# Patient Record
Sex: Female | Born: 1990 | Race: Black or African American | Hispanic: No | Marital: Single | Smoking: Never smoker
Health system: Southern US, Community
[De-identification: ages and names within clinical notes are randomized; demographics above are authoritative.]

---

## 2010-01-27 ENCOUNTER — Encounter: Payer: Self-pay | Admitting: Family Medicine

## 2010-01-27 ENCOUNTER — Encounter: Payer: Self-pay | Admitting: Sports Medicine

## 2010-02-01 ENCOUNTER — Ambulatory Visit: Payer: Self-pay | Admitting: Sports Medicine

## 2010-02-01 DIAGNOSIS — M79609 Pain in unspecified limb: Secondary | ICD-10-CM | POA: Insufficient documentation

## 2010-02-01 DIAGNOSIS — R269 Unspecified abnormalities of gait and mobility: Secondary | ICD-10-CM | POA: Insufficient documentation

## 2010-02-02 ENCOUNTER — Encounter: Payer: Self-pay | Admitting: Family Medicine

## 2010-04-26 NOTE — Letter (Signed)
Summary: *Consult Note  [facility]  [address]   [place], [place] [address]   Phone: [phone]  Fax: [phone]    Re:    Meghan Hancock DOB:    1990/12/16   Dear Dr. Margaretha Sheffield:    Thank you for requesting that we see the above patient for consultation.  A copy of the detailed office note will be sent under separate cover, for your review.  Evaluation today is consistent with:  1)  ABNORMALITY OF GAIT (ICD-781.2) 2)  FOOT PAIN, RIGHT (ICD-729.5)   Our recommendation is for:  Meghan Hancock was fitted with temporary orthotics with scaphoid pads in office today.  Unfortunately, she did not have any shoes with her today, so unable to create custom orthotics at this time.  Will give trial of temporary orthotics and consider making custom orthotics for her basketball shoes at a later date.  I will follow-up with her at the [facility] next week.   New Orders include:  1)  Consultation Level II [99242] 2)  Sports Insoles [L3510] 3)  Foot Orthosis ( Arch Strap/Heel Cup) [X5284]   New Medications started today include: None   After today's visit, the patients current medications include: None   Thank you for this consultation.  If you have any further questions regarding the care of this patient, please do not hesitate to contact me @ [phone].  Thank you for this opportunity to look after your patient.  Sincerely,   Darene Lamer DO [facility]

## 2010-04-26 NOTE — Consult Note (Signed)
Summary: [facility]  [facility]   Imported By: Marily Memos 01/28/2010 10:06:55  _____________________________________________________________________  External Attachment:    Type:   Image     Comment:   External Document

## 2010-04-26 NOTE — Letter (Signed)
Summary: [facility]  [facility]   Imported By: Marily Memos 02/08/2010 14:03:44  _____________________________________________________________________  External Attachment:    Type:   Image     Comment:   External Document

## 2010-04-26 NOTE — Letter (Signed)
Summary: [facility]  [facility]   Imported By: Marily Memos 02/01/2010 15:47:16  _____________________________________________________________________  External Attachment:    Type:   Image     Comment:   External Document

## 2010-04-26 NOTE — Assessment & Plan Note (Signed)
Summary: ORTHOTICS PER [facility]   Vital Signs:  Patient profile:   20 year old female Height:      66 inches Weight:      140 pounds BMI:     22.68 BP sitting:   125 / 82  Vitals Entered By: Lillia Pauls CMA (February 01, 2010 3:26 PM)  CC:  possible orthotics.  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 Margaretha Sheffield.  Pt thought to have developed 4th/5th MT stress reaction after starting basketball practice.  She was treated successfully with post-op shoe & cross-training.  She is starting to advance activity and is able to job without difficulty or pain.  No previous issues with stress fractures or foot pain.  Allergies (verified): No Known Drug Allergies  Social History: student at [employer] player at [employer]  Review of Systems      See HPI  Physical Exam  General:  Well-developed,well-nourished,in no acute distress; alert,appropriate and cooperative throughout examination Msk:  FEET: no TTP along MTs, tarsals, phalanges b/l.  No pain with metatarsal squeeze b/l.   moderate pes planus b/l with calcaneal valgus.  Small mortons callous b/l.  GAIT: no leg length difference.  Increased dynamic pronation with walking.  Walks without a limp. Pulses:  +2/4 b/l Neurologic:  sensation intact to light touch.     Impression & Recommendations:  Problem # 1:  FOOT PAIN, RIGHT (ICD-729.5)  - Secondary to mild stress reaction of 4th & 5th MTs, pain resolved at this time  Orders: Sports Insoles (L3510) Foot Orthosis ( Arch Strap/Heel Cup) [phone])  Problem # 2:  ABNORMALITY OF GAIT (ICD-781.2)  - Moderate pes planus with calcaneal valgum & dynamic pronation with walking.  Abnormal gait mechanics likely contributed to her foot pain & stress reaction - Did not bring shoes to office today, so only able to fit with temporary orthotics today.  Fitted with Sports Insoles with small  scaphoid pads.  Should place these in her shoes & also wear in her basketball shoes. - Will have her follow-up with me at [facility] training room in the next week.  Would likely be candidate for custom orthotics for basketball shoes in the future.  Orders: Sports Insoles [phone]) Foot Orthosis ( Arch Strap/Heel Cup) [phone])   Orders Added: 1)  Consultation Level II [99242] 2)  Sports Insoles [L3510] 3)  Foot Orthosis ( Arch Strap/Heel Cup) [A2130]

## 2011-10-19 ENCOUNTER — Other Ambulatory Visit: Payer: Self-pay | Admitting: Family Medicine

## 2011-10-19 ENCOUNTER — Emergency Department (INDEPENDENT_AMBULATORY_CARE_PROVIDER_SITE_OTHER): Payer: Federal, State, Local not specified - PPO

## 2011-10-19 ENCOUNTER — Emergency Department (INDEPENDENT_AMBULATORY_CARE_PROVIDER_SITE_OTHER)
Admission: EM | Admit: 2011-10-19 | Discharge: 2011-10-19 | Disposition: A | Discharge status: Home / Self Care | Payer: Federal, State, Local not specified - PPO | Source: Home / Self Care

## 2011-10-19 ENCOUNTER — Encounter: Payer: Self-pay | Admitting: *Deleted

## 2011-10-19 DIAGNOSIS — N760 Acute vaginitis: Secondary | ICD-10-CM

## 2011-10-19 DIAGNOSIS — N898 Other specified noninflammatory disorders of vagina: Secondary | ICD-10-CM

## 2011-10-19 DIAGNOSIS — R1031 Right lower quadrant pain: Secondary | ICD-10-CM

## 2011-10-19 DIAGNOSIS — M549 Dorsalgia, unspecified: Secondary | ICD-10-CM

## 2011-10-19 DIAGNOSIS — R52 Pain, unspecified: Secondary | ICD-10-CM

## 2011-10-19 DIAGNOSIS — R1032 Left lower quadrant pain: Secondary | ICD-10-CM

## 2011-10-19 DIAGNOSIS — K5900 Constipation, unspecified: Secondary | ICD-10-CM

## 2011-10-19 LAB — POCT URINE PREGNANCY: Preg Test, Ur: NEGATIVE

## 2011-10-19 LAB — POCT URINALYSIS DIP (MANUAL ENTRY)
Blood, UA: NEGATIVE
Protein Ur, POC: NEGATIVE
Spec Grav, UA: 1.02 (ref 1.005–1.03)
Urobilinogen, UA: 0.2 (ref 0–1)
pH, UA: 5.5 (ref 5–8)

## 2011-10-19 MED ORDER — FLUCONAZOLE 150 MG PO TABS: 150.0000 mg | ORAL_TABLET | Freq: Once | ORAL | Status: AC

## 2011-10-19 MED ORDER — POLYETHYLENE GLYCOL 3350 17 GM/SCOOP PO POWD: 17.0000 g | Freq: Every day | ORAL | Status: AC

## 2011-10-19 MED ORDER — METRONIDAZOLE 500 MG PO TABS: 500.0000 mg | ORAL_TABLET | Freq: Two times a day (BID) | ORAL | Status: AC

## 2011-10-19 NOTE — ED Notes (Signed)
Patient c/o mid/low back pain after meals and drinking fluids x 2 months. She also reports discharge, itching and yeast x 1 month.

## 2011-10-19 NOTE — ED Provider Notes (Signed)
History     CSN: [id number]  Arrival date & time 10/19/11  1400   First MD Initiated Contact with Patient 10/19/11 1403      Chief Complaint  Patient presents with  . Back Pain  . Vaginal Discharge   HPI  Back pain: Patient reports back pain after eating over the past 2 months. Patient states that back pain is usually midthoracic. Patient denies any radiation of back pain to the abdomen. Patient describes back pain is sharp. Back pain seems to be associated with both eating and drinking. Patient denies any recent back trauma. No history of kidney stones. No dysuria, hematuria, increased urinary frequency. 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 This back pain is different. Patient denies any recent changes in her diet. Patient does complain of significant gaseous distention associated with eating and drinking. Patient denies any constipation though does report having 2-3 bowel movements per week.  VAGINAL DISCHARGE Onset: 1 month Description: vaginal itching and discharge  Modifying factors: Was previously [place] Ob-Gyn in [place] for BV and ? Yeast infection. Sxs resolved x 1 month and then recurred. Sxs feel like previous yeast infections in the past.   Symptoms Odor: mild  Itching: yes Vaginal burning: mild Dysuria: no Bleeding: no Pelvic pain:+ RLQ pain, mild  Back pain: yes Fever: no Genital sores: no Rash: no Dyspareunia: no GI Symptoms: no  Red Flags:  Missed period: no Recent antibiotics: no Possible STD exposure: no IUD: no Diabetes: no    History reviewed. No pertinent past medical history.  History reviewed. No pertinent past surgical history.  Family History  Problem Relation Age of Onset  . Hypertension Mother   . Hypertension Father   . Hypertension Sister     History  Substance Use Topics  . Smoking status: Never Smoker   . Smokeless tobacco: Not on file  .  Alcohol Use: No    OB History    Grav Para Term Preterm Abortions TAB SAB Ect Mult Living                  Review of Systems  All other systems reviewed and are negative.    Allergies  Review of patient's allergies indicates no known allergies.  Home Medications   Current Outpatient Rx  Name Route Sig Dispense Refill  . FLUCONAZOLE 150 MG PO TABS Oral Take 1 tablet (150 mg total) by mouth once. 1 tablet 0  . POLYETHYLENE GLYCOL 3350 PO POWD Oral Take 17 g by mouth daily. 255 g 0    BP 134/85  Pulse 65  Temp 98.3 F (36.8 C) (Oral)  Resp 16  Ht 5\' 6"  (1.676 m)  Wt 140 lb (63.504 kg)  BMI 22.60 kg/m2  SpO2 100%  LMP 10/04/2011  Physical Exam  Constitutional: She is oriented to person, place, and time. She appears well-developed and well-nourished.  HENT:  Head: Normocephalic and atraumatic.  Eyes: Conjunctivae are normal. Pupils are equal, round, and reactive to light.  Neck: Normal range of motion. Neck supple.  Cardiovascular: Normal rate and regular rhythm.   Pulmonary/Chest: Effort normal and breath sounds normal.  Abdominal: Soft.       Mild RLQ tenderness, + bowel sounds   Genitourinary:       Normal external genitalia, + vaginal discharge, No CMT, minimal to mild adnexal tenderness  Marked stool burden on palpation of posterior portion of vaginal cavity  Musculoskeletal: Normal range of motion.       Mild mid thoracic tenderness to palpation    Neurological: She is alert and oriented to person, place, and time.  Skin: Skin is warm.    ED Course  Procedures (including critical care time)   Labs Reviewed  POCT URINALYSIS DIP (MANUAL ENTRY)  POCT URINE PREGNANCY  WET PREP, GENITAL  GC/CHLAMYDIA PROBE AMP, GENITAL   Dg Abd 1 View  10/19/2011  *RADIOLOGY REPORT*  Clinical Data: Back pain  ABDOMEN - 1 VIEW  Comparison: None.  Findings: No dilated loops of large or small bowel.  Gas and stool within rectum.  No pathologic calcifications.  Lumbar  scoliosis noted.  IMPRESSION: Moderate volume stool throughout colon.  No acute findings.  Original Report Authenticated By: Genevive Bi, M.D.     1. Constipation   2. Vaginal Discharge   3. RLQ abdominal pain       MDM  Back pain assd with eating and drinking likely secondary to constipation. Noted stool burden on KUB.  Will start pt on miralax for this. No clinical red flags/risk factors for GB disease currently (no RUQ pain, nausea, non obese), though would consider abd u/s if sxs persist.   Will treat pt for BV and vaginal candidiasis.  UA nonremarkable.  U preg negative.  Will send off GC/Chl.  Will send off wet prep.  Given mild adnexal tenderness, will obtain pelvic u/s to eval for GYN process.  Discussed gyn and infectious red flags for reevaluation.  Follow up pending imaging and lab results.  Handout given.      The patient and/or caregiver has been counseled thoroughly with regard to treatment plan and/or medications prescribed including dosage, schedule, interactions, rationale for use, and possible side effects and they verbalize understanding. Diagnoses and expected course of recovery discussed and will return if not improved as expected or if the condition worsens. Patient and/or caregiver verbalized understanding.               Floydene Flock, MD 10/19/11 [phone]

## 2011-10-20 ENCOUNTER — Ambulatory Visit (INDEPENDENT_AMBULATORY_CARE_PROVIDER_SITE_OTHER): Payer: Federal, State, Local not specified - PPO

## 2011-10-20 DIAGNOSIS — N949 Unspecified condition associated with female genital organs and menstrual cycle: Secondary | ICD-10-CM

## 2011-10-20 DIAGNOSIS — R52 Pain, unspecified: Secondary | ICD-10-CM

## 2011-10-20 LAB — GC/CHLAMYDIA PROBE AMP, GENITAL
Chlamydia, DNA Probe: NEGATIVE
GC Probe Amp, Genital: NEGATIVE

## 2011-10-20 LAB — WET PREP, GENITAL: Trich, Wet Prep: NONE SEEN

## 2011-10-22 ENCOUNTER — Telehealth: Payer: Self-pay | Admitting: Emergency Medicine

## 2011-10-23 ENCOUNTER — Telehealth: Payer: Self-pay

## 2011-10-23 NOTE — Telephone Encounter (Signed)
Message copied by Chalmers Cater on Mon Oct 23, 2011  8:57 AM ------      Message from: Floydene Flock      Created: Mon Oct 23, 2011  8:14 AM       Please inform pt that ultrasound results were negative

## 2011-10-23 NOTE — ED Notes (Signed)
Patient advised of ultrasound results.  

## 2011-10-26 NOTE — ED Provider Notes (Signed)
Agree with exam, assessment, and plan.   Owens Hara A Brittannie Tawney, MD 10/26/11 0846 

## 2012-01-25 ENCOUNTER — Encounter: Payer: Self-pay | Admitting: Sports Medicine

## 2012-01-25 ENCOUNTER — Ambulatory Visit (INDEPENDENT_AMBULATORY_CARE_PROVIDER_SITE_OTHER): Payer: Federal, State, Local not specified - PPO | Admitting: Sports Medicine

## 2012-01-25 VITALS — BP 123/79 | HR 53 | Ht 66.0 in | Wt 140.0 lb

## 2012-01-25 DIAGNOSIS — M2141 Flat foot [pes planus] (acquired), right foot: Secondary | ICD-10-CM

## 2012-01-25 DIAGNOSIS — M216X9 Other acquired deformities of unspecified foot: Secondary | ICD-10-CM

## 2012-01-25 DIAGNOSIS — M2140 Flat foot [pes planus] (acquired), unspecified foot: Secondary | ICD-10-CM

## 2012-01-25 DIAGNOSIS — M2142 Flat foot [pes planus] (acquired), left foot: Secondary | ICD-10-CM

## 2012-01-25 NOTE — Progress Notes (Signed)
  Subjective:    Patient ID: Meghan Hancock, female    DOB: 09-09-1990, 21 y.o.   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 She was evaluated by Dr. Clementeen Graham in the training room who recommended she come in today for orthotics. She has no foot pain currently but basketball season is getting ready to start and she has a history of a stress fracture in her left foot in 2011. She is otherwise healthy. Takes no chronic medications. Socially she does not smoke, does not drink alcohol, and is a Consulting civil engineer at [employer].    Review of Systems     Objective:   Physical Exam Well-developed, well-nourished. No acute distress. Awake alert and oriented x3  Examination of each of her feet in the standing position shows moderate pes planus. She has significant pronation with running and walking. She has no tenderness to palpation across the dorsum of her foot. No pain with metatarsal squeeze. Ankle exam is unremarkable. Neurovascularly intact distally. Walking without a limp.       Assessment & Plan:  1. Bilateral pes planus with pronation   Patient is fitted today with a pair of green sports insoles with scaphoid pads. She will wear these in her basketball shoes. We will schedule a followup visit in 3 weeks and we will plan on making her custom orthotics at that time if she finds the temporary inserts to be comfortable.

## 2012-02-15 ENCOUNTER — Ambulatory Visit: Payer: Federal, State, Local not specified - PPO | Admitting: Sports Medicine

## 2012-03-07 ENCOUNTER — Ambulatory Visit (INDEPENDENT_AMBULATORY_CARE_PROVIDER_SITE_OTHER): Payer: Federal, State, Local not specified - PPO | Admitting: Family Medicine

## 2012-03-07 VITALS — BP 110/74 | Ht 66.0 in | Wt 138.0 lb

## 2012-03-07 DIAGNOSIS — R269 Unspecified abnormalities of gait and mobility: Secondary | ICD-10-CM

## 2012-03-07 NOTE — Assessment & Plan Note (Signed)
Fitted for a pair of custom orthotics today, do to significant pes planus, ankle subluxation, and abnormal gait.  Will followup as needed.

## 2012-03-07 NOTE — Progress Notes (Signed)
Meghan Hancock is a 21 y.o. female who presents to [facility] today for orthotic evaluation.  Patient was found to have significant pes planus with ankle subluxation an abnormal gait.  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  She was seen last month and fitted with a pair of temporary orthotics which have helped her pain.  She's here today for custom orthotics.    PMH reviewed.  History  Substance Use Topics  . Smoking status: Never Smoker   . Smokeless tobacco: Never Used  . Alcohol Use: No   ROS as above otherwise neg   Exam:  BP 110/74  Ht 5\' 6"  (1.676 m)  Wt 138 lb (62.596 kg)  BMI 22.27 kg/m2 Gen: Well NAD MSK: Examination of each of her feet in the standing position shows moderate pes planus. She has significant pronation with running and walking. She has no tenderness to palpation across the dorsum of her foot. No pain with metatarsal squeeze. Ankle exam is unremarkable. Neurovascularly intact distally. Walking without a limp.   Patient was fitted for a : standard, cushioned, semi-rigid orthotic. The orthotic was heated and afterward the patient stood on the orthotic blank positioned on the orthotic stand. The patient was positioned in subtalar neutral position and 10 degrees of ankle dorsiflexion in a weight bearing stance. After completion of molding, a stable base was applied to the orthotic blank. The blank was ground to a stable position for weight bearing. Size: 8 Base: Blue EVA Posting and Padding: none.

## 2012-03-29 ENCOUNTER — Ambulatory Visit (INDEPENDENT_AMBULATORY_CARE_PROVIDER_SITE_OTHER): Payer: Federal, State, Local not specified - PPO | Admitting: Sports Medicine

## 2012-03-29 VITALS — BP 121/82 | Temp 99.3°F | Ht 66.0 in | Wt 138.0 lb

## 2012-03-29 DIAGNOSIS — A088 Other specified intestinal infections: Secondary | ICD-10-CM

## 2012-03-29 DIAGNOSIS — A084 Viral intestinal infection, unspecified: Secondary | ICD-10-CM | POA: Insufficient documentation

## 2012-03-29 LAB — CBC
MCH: 31.9 pg (ref 26.0–34.0)
Platelets: 237 10*3/uL (ref 150–400)
RBC: 4.32 MIL/uL (ref 3.87–5.11)
WBC: 6.1 10*3/uL (ref 4.0–10.5)

## 2012-03-29 MED ORDER — PROMETHAZINE HCL 12.5 MG PO TABS: 12.5000 mg | ORAL_TABLET | Freq: Four times a day (QID) | ORAL | Status: DC | PRN

## 2012-03-29 NOTE — Assessment & Plan Note (Signed)
This is likely viral gastroenteritis, which is currently present in the community.  Her differential includes a remote possibility of appendicitis, however she does not have peritoneal signs currently. Plan: We'll obtain a CBC to ensure no significant leukocytosis consistent with appendicitis. Will treat with Phenergan, and oral fluid rehydration. I will check on the patient tomorrow before then and basketball game. Patient will call if she worsens. I provided by personal cell phone number

## 2012-03-29 NOTE — Progress Notes (Signed)
Meghan Hancock is a 22 y.o. female who presents to [facility] today for nausea and vomiting present for one day. Patient notes mild abdominal pain in the right lower contract additionally. Additionally she notes a headache. She is able to drink and is continuing to vomit. She denies any diarrhea or blood in the stool. She notes normal menstruation and denies any vaginal discharge or dysuria. She feels mildly weak but is able to keep fluid down and still makes urine. She notes a mild subjective fever but denies any trouble breathing chest pain palpitations.   No sick contacts  PMH reviewed. No history of abdominal surgery. Last menstrual period was in one week. History  Substance Use Topics  . Smoking status: Never Smoker   . Smokeless tobacco: Never Used  . 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 ROS as above otherwise neg   Exam:  BP 121/82  Temp 99.3 F (37.4 C) (Oral)  Ht 5\' 6"  (1.676 m)  Wt 138 lb (62.596 kg)  BMI 22.27 kg/m2 Gen: Well NAD LUNGS: Normal work of breathing, clear to auscultation bilaterally Heart: Regular rate and rhythm no murmurs rubs or gallops Abdomen: Soft nondistended normal active bowel sounds mildly tender in the right lower quadrant. No rebound or guarding. Extremities: Warm and well perfused

## 2012-03-29 NOTE — Patient Instructions (Addendum)
Thank you for coming in today. I think you have stomach virus.  It is possible but unlikely that you have appendicitis.  If you get worse let me know.  Take the promethazine for nausea as needed. It will make you sleepy.  See me at the basketball game tomorrow.  My cell is [phone]

## 2012-06-11 ENCOUNTER — Other Ambulatory Visit: Payer: Self-pay | Admitting: Family Medicine

## 2013-12-01 ENCOUNTER — Emergency Department (INDEPENDENT_AMBULATORY_CARE_PROVIDER_SITE_OTHER)
Admission: EM | Admit: 2013-12-01 | Discharge: 2013-12-01 | Disposition: A | Discharge status: Home / Self Care | Payer: Federal, State, Local not specified - PPO | Source: Home / Self Care | Attending: Family Medicine | Admitting: Family Medicine

## 2013-12-01 DIAGNOSIS — M62838 Other muscle spasm: Secondary | ICD-10-CM

## 2013-12-01 MED ORDER — CYCLOBENZAPRINE HCL 10 MG PO TABS: 10.0000 mg | ORAL_TABLET | Freq: Three times a day (TID) | ORAL | Status: AC

## 2013-12-01 NOTE — ED Provider Notes (Signed)
CSN: [id number]     Arrival date & time 12/01/13  1646 History   First MD Initiated Contact with Patient 12/01/13 1737     Chief Complaint  Patient presents with  . Neck Pain      HPI Comments: Patient awoke this morning with pain in her upper back and neck, radiating to the shoulders.  The pain is worse with movement and deep inspiration.  No fevers, chills, and sweats.  She recalls no injury or change in physical activities. She had a similar occurrence 7 months ago, resolved with Flexeril.  Patient is a 23 y.o. female presenting with back pain. The history is provided by the patient.  Back Pain Pain location: upper back and neck. Quality:  Aching Radiates to:  R shoulder Pain severity:  Mild Pain is:  Same all the time Onset quality:  Sudden Duration:  9 hours Timing:  Constant Progression:  Unchanged Chronicity:  New Relieved by:  None tried Worsened by:  Deep breathing and lying down Ineffective treatments:  NSAIDs Associated symptoms: no chest pain, no fever and no paresthesias     No past medical history on file. No past surgical history on file. Family History  Problem Relation Age of Onset  . Hypertension Mother   . Hypertension Father   . Hypertension Sister    History  Substance Use Topics  . Smoking status: Never Smoker   . Smokeless tobacco: Never Used  . Alcohol Use: No   OB History   Grav Para Term Preterm Abortions TAB SAB Ect Mult Living                 Review of Systems  Constitutional: Negative for fever.  Cardiovascular: Negative for chest pain.  Musculoskeletal: Positive for back pain.  Neurological: Negative for paresthesias.  All other systems reviewed and are negative.   Allergies  Review of patient's allergies indicates no known allergies.  Home Medications   Prior to Admission medications   Medication Sig Start Date End Date Taking? Authorizing Provider  cyclobenzaprine (FLEXERIL) 10 MG tablet Take 1 tablet (10 mg total) by mouth  3 (three) times daily. 12/01/13   Lattie Haw, MD   BP 132/85  Pulse 79  Temp(Src) 98.4 F (36.9 C) (Oral)  Ht  (1.676 m)  Wt 146 lb (66.225 kg)  BMI 23.58 kg/m2  SpO2 99% Physical Exam  Nursing note and vitals reviewed. Constitutional: She is oriented to person, place, and time. She appears well-developed and well-nourished. No distress.  HENT:  Head: Normocephalic.  Mouth/Throat: Oropharynx is clear and moist.  Eyes: Conjunctivae and EOM are normal. Pupils are equal, round, and reactive to light.  Neck: Normal range of motion. Neck supple.  Cardiovascular: Normal heart sounds.   Pulmonary/Chest: Breath sounds normal. She exhibits tenderness.    There is tenderness to palpation over the inferior sternum as noted on diagram.    Abdominal: There is no tenderness.  Musculoskeletal:       Cervical back: She exhibits tenderness. She exhibits normal range of motion and no bony tenderness.       Back:  There is tenderness to palpation over the posterior neck and trapezius muscles bilaterally.  Lymphadenopathy:    She has no cervical adenopathy.  Neurological: She is alert and oriented to person, place, and time.  Skin: Skin is warm and dry. No rash noted.    ED Course  Procedures  none     MDM   1. Trapezius muscle  spasm    Begin Flexeril. Apply ice pack for 15 to 20 minutes, 3 to 4 times daily  Continue until pain decreases.  May take Ibuprofen , 4 tabs every 8 hours with food.  Begin neck range of motion and stretching exercises. Followup with Dr. Rodney [facility] ([facility]) if not improving about two weeks.     Lattie Haw, MD 12/03/13 [phone]

## 2013-12-01 NOTE — ED Notes (Signed)
Neck and shoulder pain when taking a breath her breathing doesn't seem right 7/10

## 2013-12-01 NOTE — Discharge Instructions (Signed)
Apply ice pack for 15 to 20 minutes, 3 to 4 times daily  Continue until pain decreases.  May take Ibuprofen , 4 tabs every 8 hours with food.  Begin neck range of motion and stretching exercises.

## 2014-03-07 IMAGING — CR DG ABDOMEN 1V
1 series · 1 of 1 positions shown · non-contrast
Comparison: None.

CLINICAL DATA: Back pain

ABDOMEN - 1 VIEW

[view not recorded]
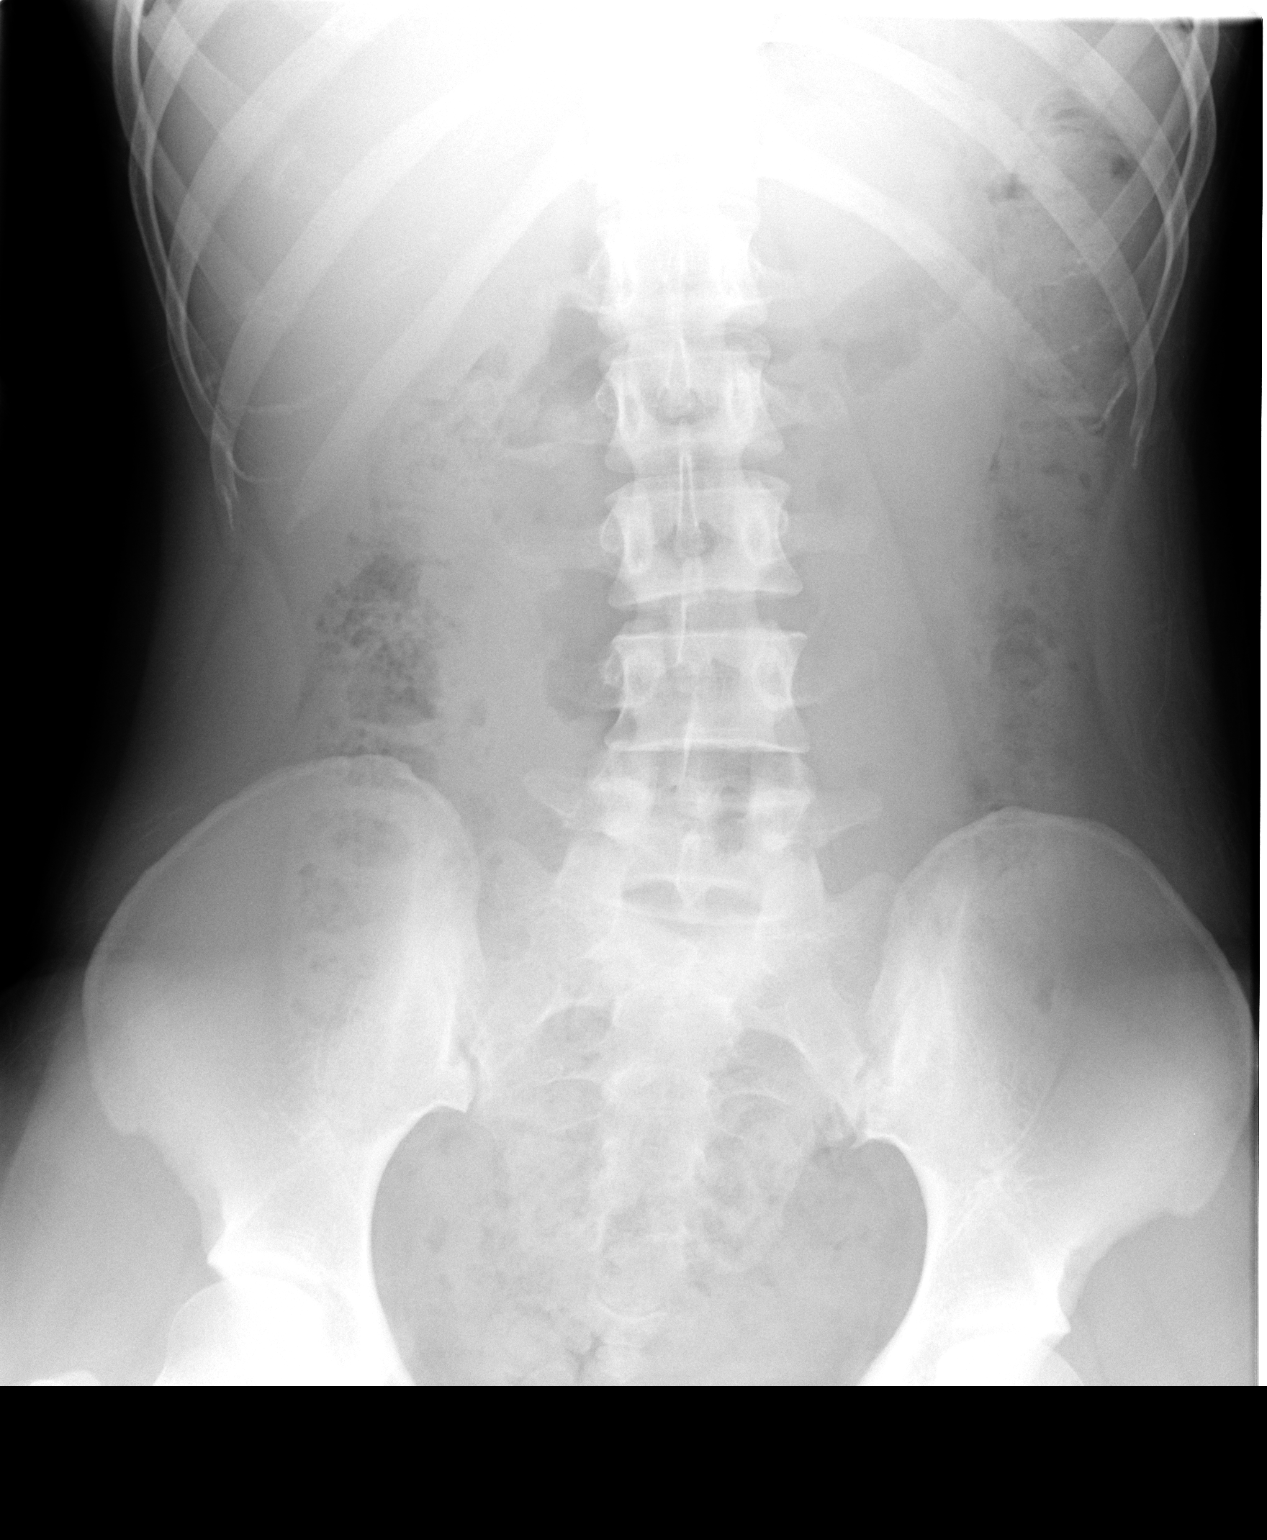

[1 of 1 positions shown; findings below may reference images not displayed]

FINDINGS: No dilated loops of large or small bowel.  Gas and stool
within rectum.  No pathologic calcifications.  Lumbar scoliosis
noted.
IMPRESSION: Moderate volume stool throughout colon.  No acute findings.

## 2014-03-08 IMAGING — US US PELVIS COMPLETE
1 series · 14 of 25 positions shown · non-contrast
Comparison: None.

CLINICAL DATA: Right-sided pelvic pain. LMP 10/04/2011.

TRANSABDOMINAL AND TRANSVAGINAL ULTRASOUND OF PELVIS
TECHNIQUE: Both transabdominal and transvaginal ultrasound
examinations of the pelvis were performed.  Transabdominal
technique was performed for global imaging of the pelvis including
uterus, ovaries, adnexal regions, and pelvic cul-de-sac.
It was necessary to proceed with endovaginal exam following the
transabdominal exam to visualize the endometrium and left ovarian
cyst.

[Series 1: us pelvis complete · 0.28mm/px · 14 of 48 slices shown]
[im 1/48]
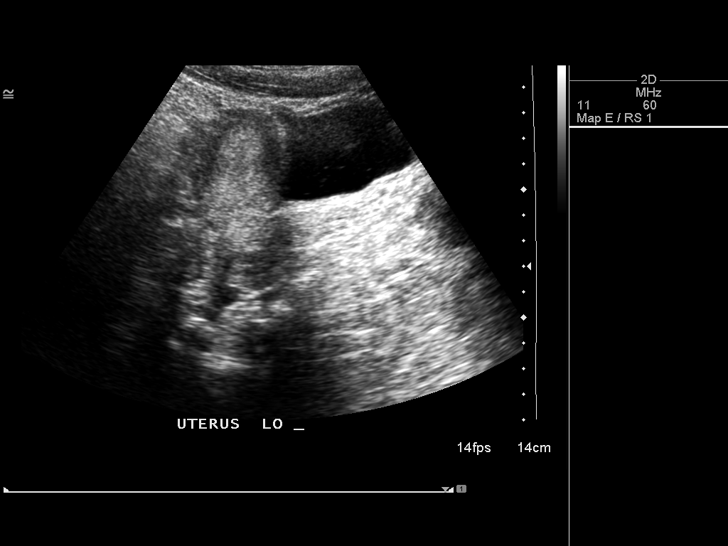
[im 4/48]
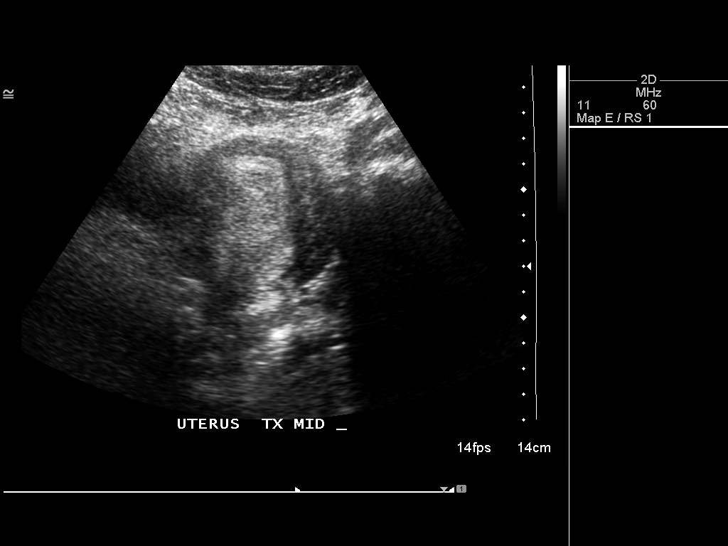
[im 8/48]
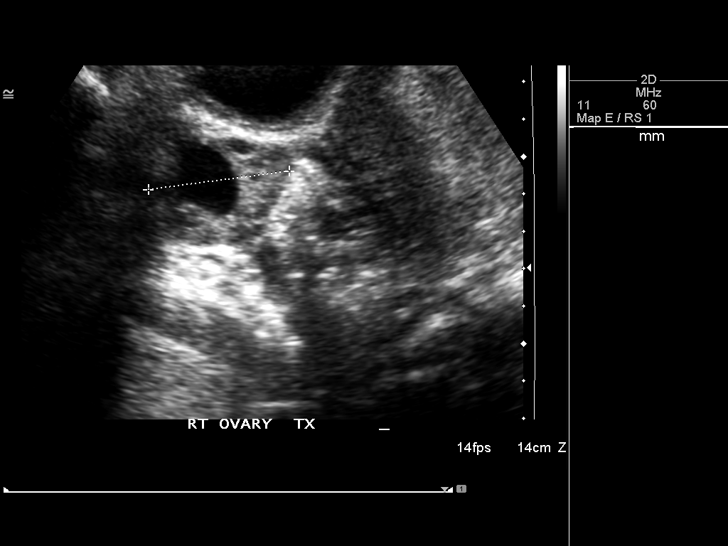
[im 12/48]
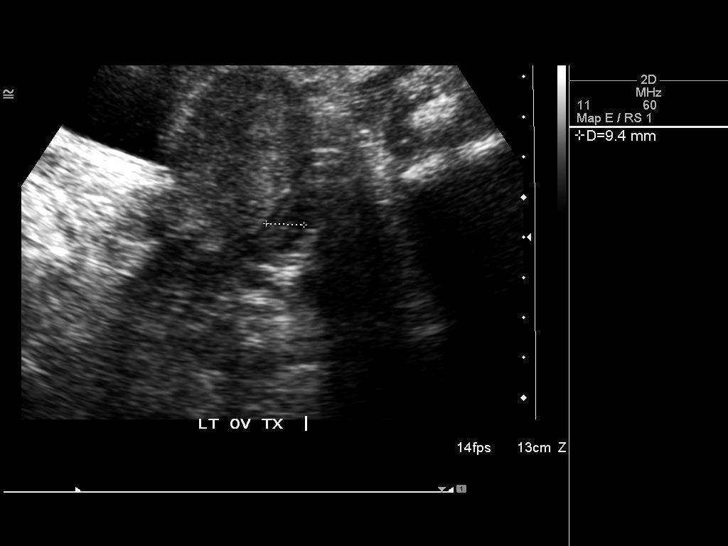
[im 16/48]
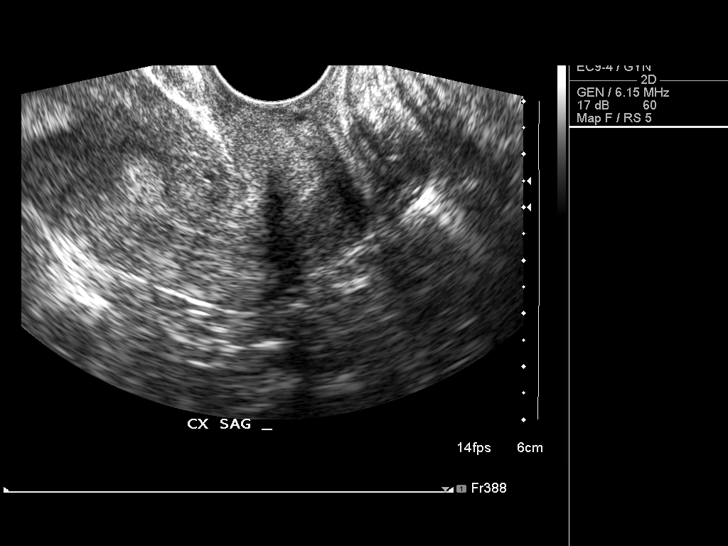
[im 18/48]
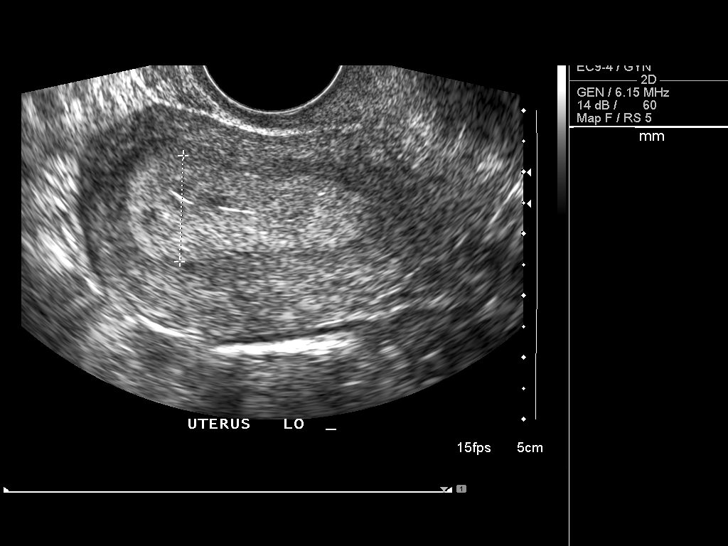
[im 22/48]
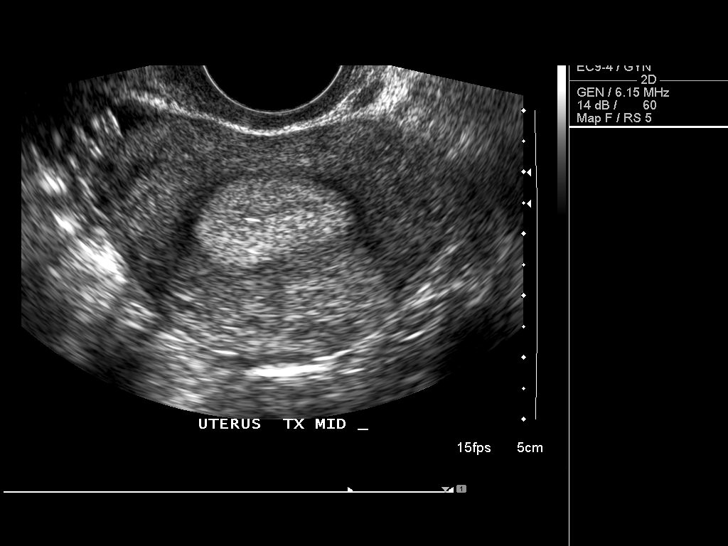
[im 26/48]
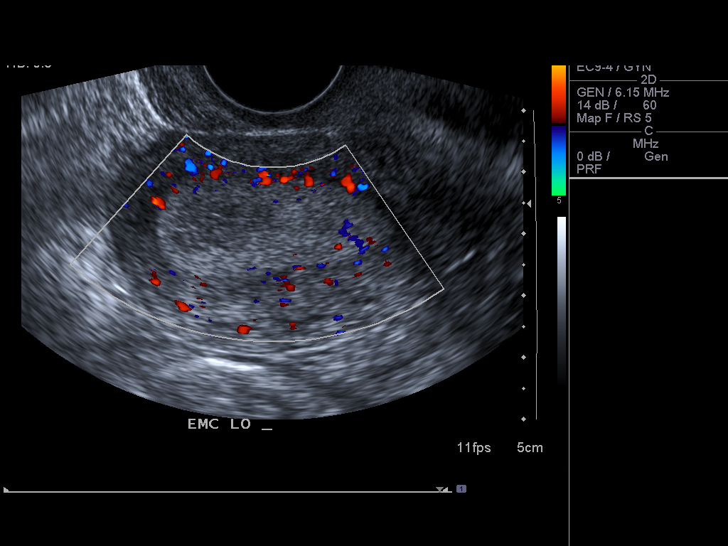
[im 30/48]
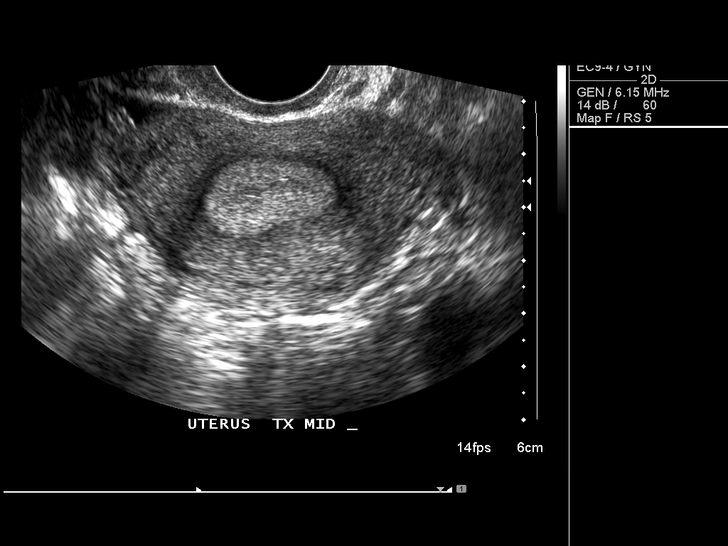
[im 32/48]
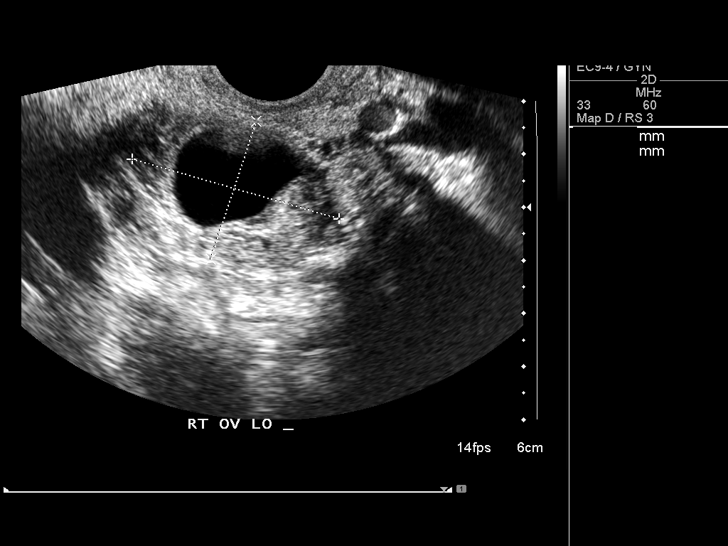
[im 36/48]
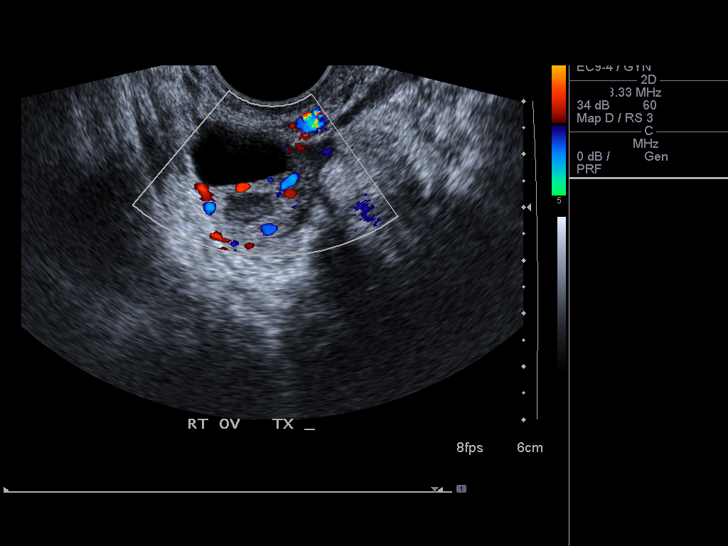
[im 40/48]
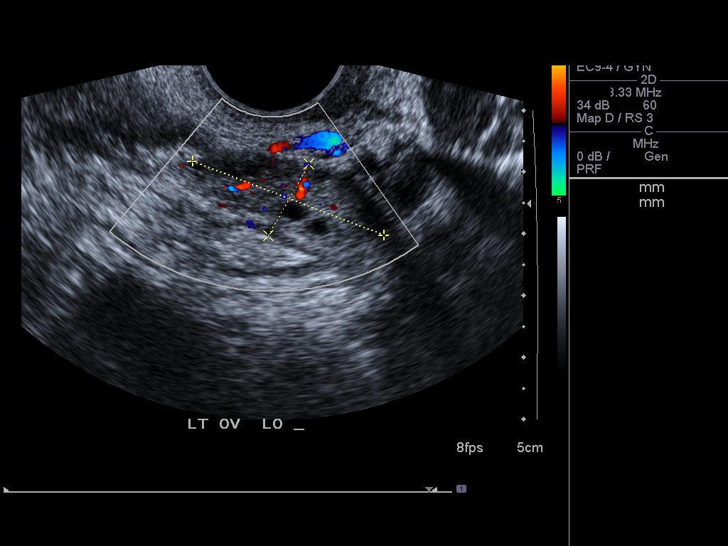
[im 44/48]
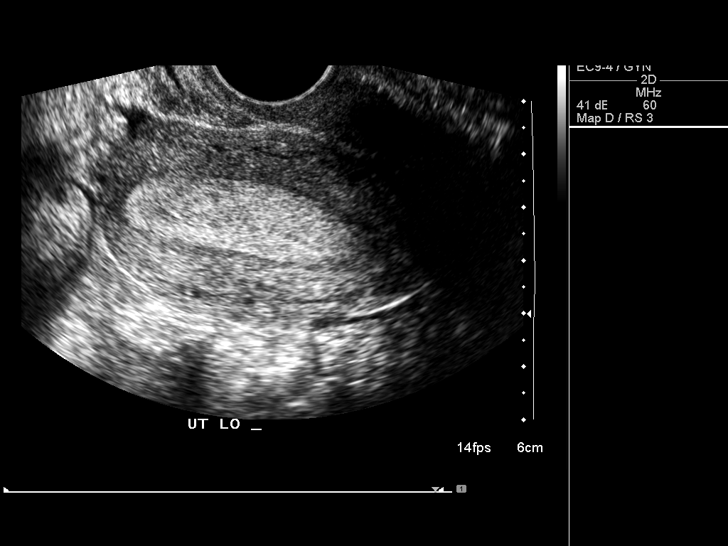
[im 48/48]
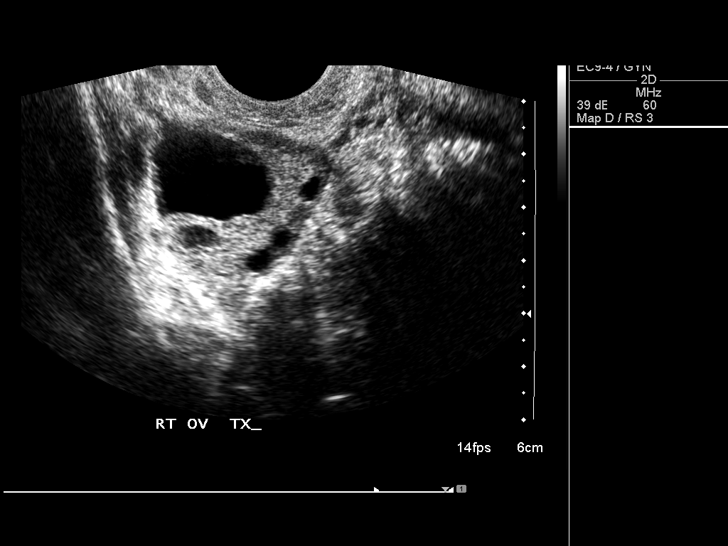

[14 of 25 positions shown; findings below may reference images not displayed]

FINDINGS: Uterus:  8.6 x 3.6 x 6.0 cm.  No fibroids or other uterine mass
identified.

Endometrium: Double layer thickness measures 17 mm transvaginally.
No focal lesion visualized.

Right ovary: 4.1 x 2.7 x 3.5 cm.  Normal appearance.  Dominant
follicle noted measuring 2.6 cm.

Left ovary: 3.3 x 1.3 x 1.7 cm.  Normal appearance.

Other Findings:  No free fluid
IMPRESSION: Normal study.  No evidence of pelvic mass or other significant
abnormality.

## 2019-11-04 ENCOUNTER — Other Ambulatory Visit: Payer: Self-pay

## 2019-11-04 ENCOUNTER — Other Ambulatory Visit: Payer: PRIVATE HEALTH INSURANCE

## 2019-11-04 DIAGNOSIS — Z20822 Contact with and (suspected) exposure to covid-19: Secondary | ICD-10-CM

## 2019-11-05 LAB — NOVEL CORONAVIRUS, NAA: SARS-CoV-2, NAA: NOT DETECTED

## 2019-11-05 LAB — SARS-COV-2, NAA 2 DAY TAT
